# Patient Record
Sex: Female | Born: 1955 | Race: Black or African American | Hispanic: No | Marital: Single | Smoking: Current every day smoker
Health system: Southern US, Community
[De-identification: ages and names within clinical notes are randomized; demographics above are authoritative.]

## PROBLEM LIST (undated history)

## (undated) DIAGNOSIS — E785 Hyperlipidemia, unspecified: Secondary | ICD-10-CM

## (undated) HISTORY — DX: Hyperlipidemia, unspecified: E78.5

---

## 1978-12-21 HISTORY — PX: ABDOMINAL HYSTERECTOMY: SHX81

## 2005-07-02 ENCOUNTER — Emergency Department: Payer: Self-pay | Admitting: Emergency Medicine

## 2005-11-10 ENCOUNTER — Emergency Department: Payer: Self-pay | Admitting: Emergency Medicine

## 2006-01-23 ENCOUNTER — Emergency Department: Payer: Self-pay | Admitting: Emergency Medicine

## 2006-07-15 IMAGING — CT CT STONE STUDY
1 of 2 series · 15 of 32 positions shown, 19 images · non-contrast
Comparison: none

REASON FOR EXAM: Abdominal pain
COMMENTS:

[Series 2: stone · axial · 0.72mm/px · z∈[-989,-626]mm · 15 of 136 slices shown, 19 images]
[im 10/136  soft-tissue]
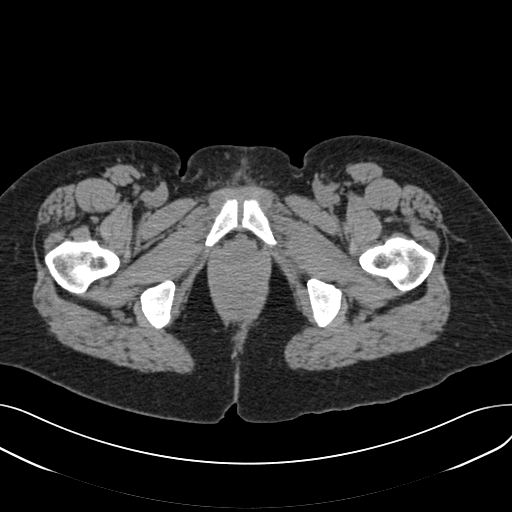
[im 10/136  bone]
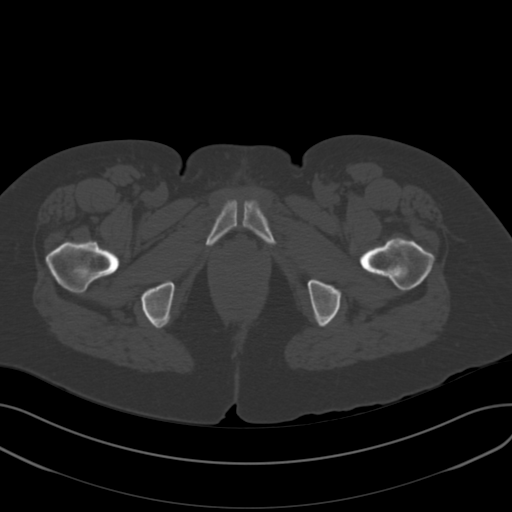
[im 20/136  soft-tissue]
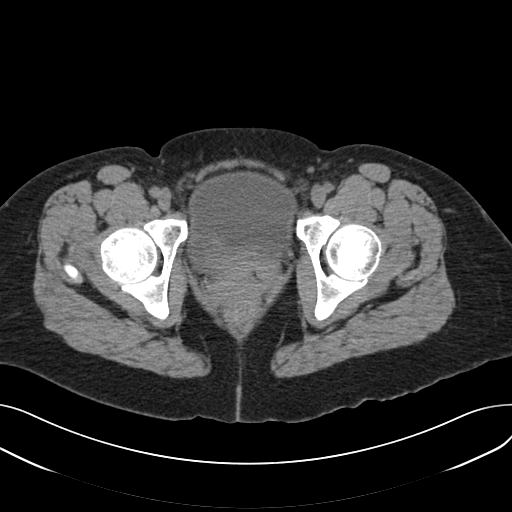
[im 29/136  soft-tissue]
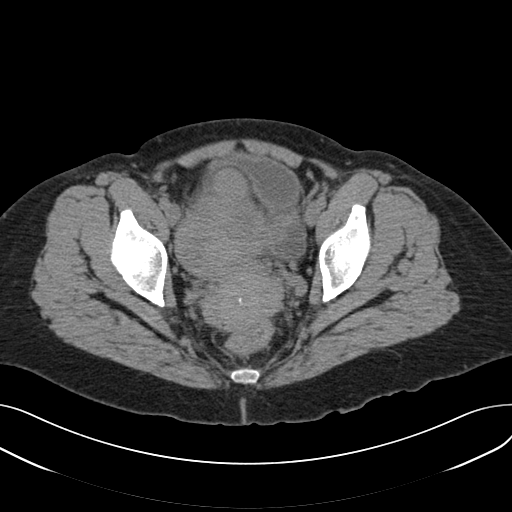
[im 39/136  soft-tissue]
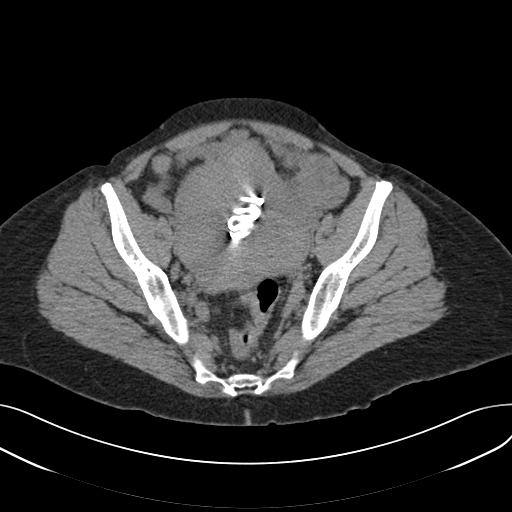
[im 49/136  soft-tissue]
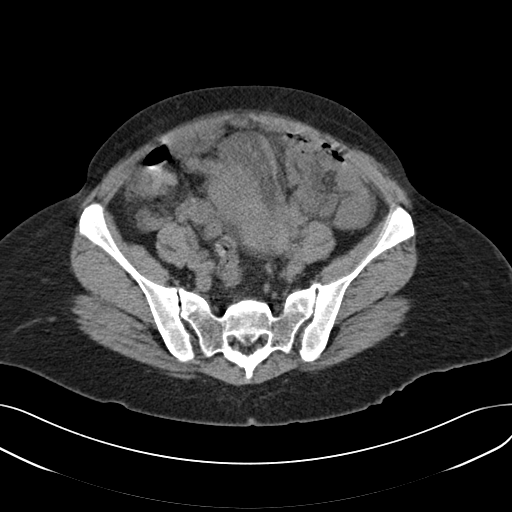
[im 58/136  soft-tissue]
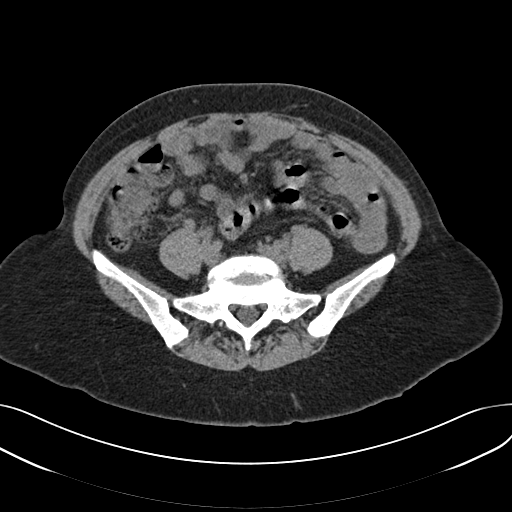
[im 68/136  soft-tissue]
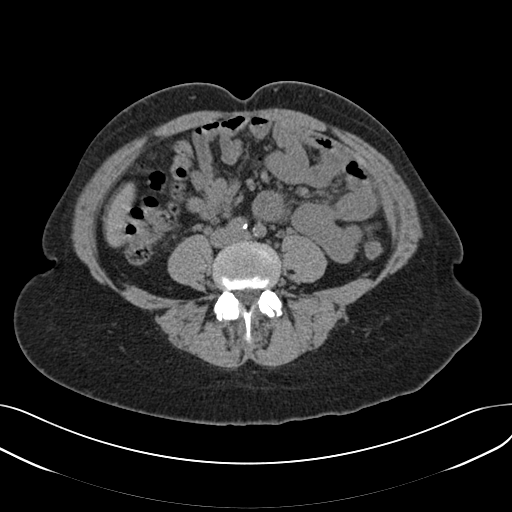
[im 78/136  soft-tissue]
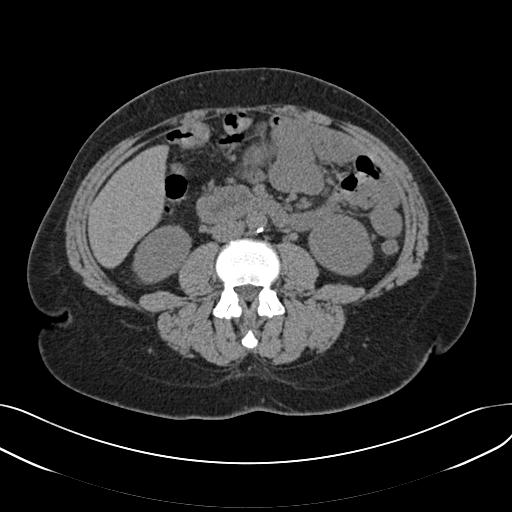
[im 87/136  soft-tissue]
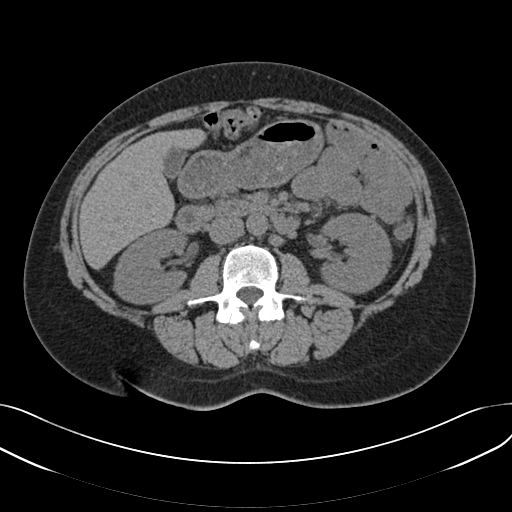
[im 87/136  bone]
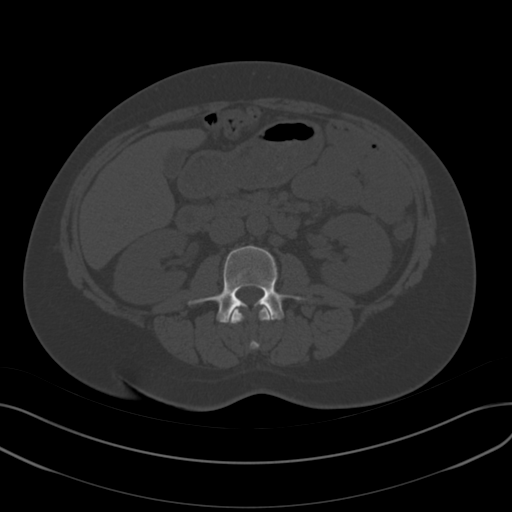
[im 97/136  soft-tissue]
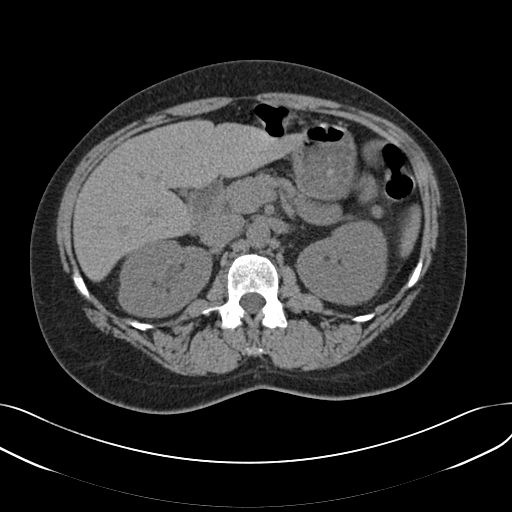
[im 107/136  soft-tissue]
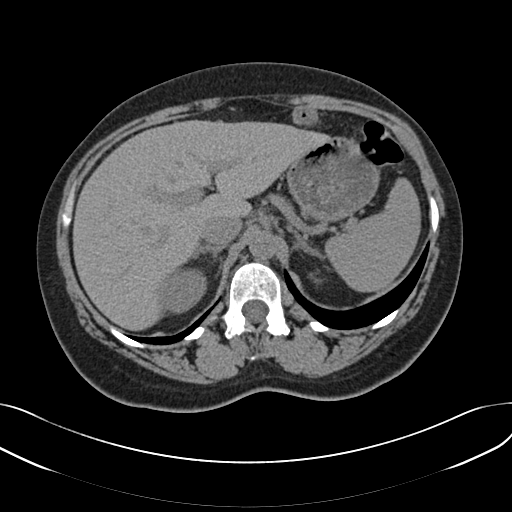
[im 116/136  soft-tissue]
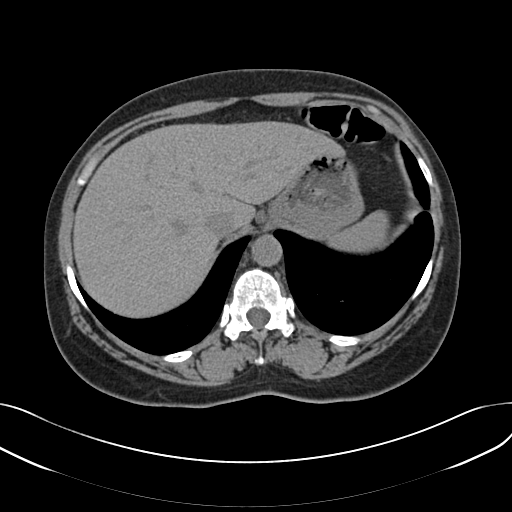
[im 116/136  lung]
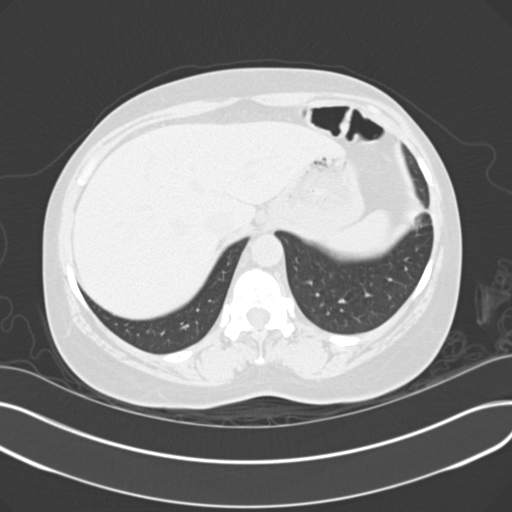
[im 121/136  lung]
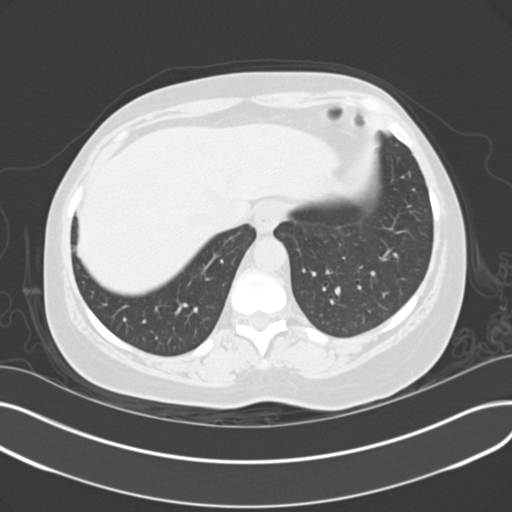
[im 126/136  soft-tissue]
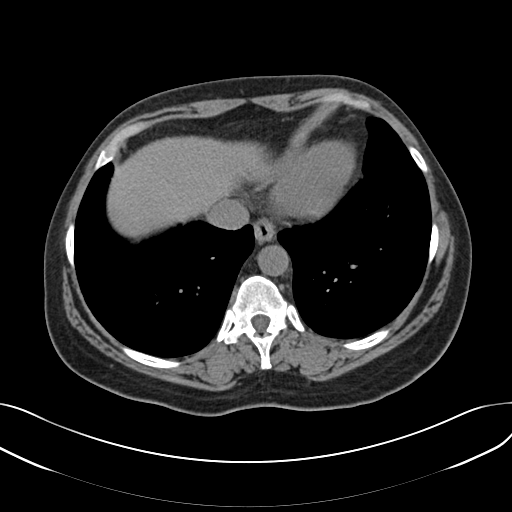
[im 126/136  lung]
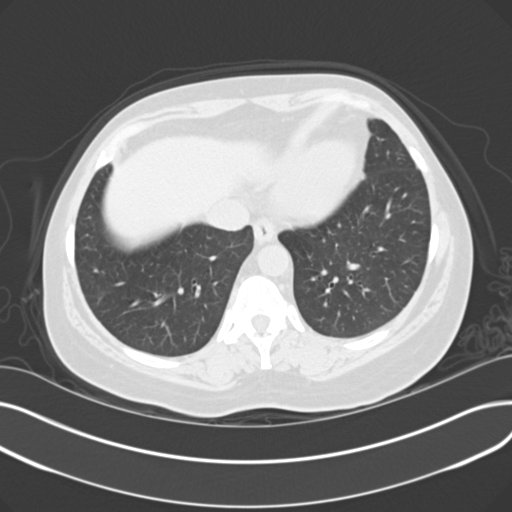
[im 131/136  lung]
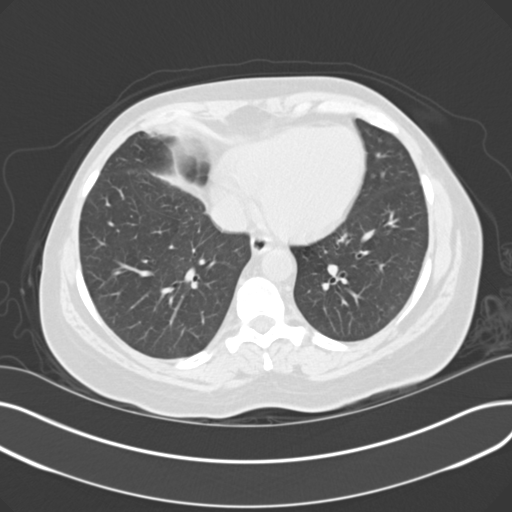

[15 of 32 positions shown; findings below may reference images not displayed]

PROCEDURE:     CT  - CT ABDOMEN /PELVIS WO (STONE)  - January 24, 2006  [DATE]

RESULT:          Spiral noncontrasted 3 mm sections were obtained from the
lung bases through the pubic symphysis.

Evaluation of the lung bases demonstrates no gross abnormalities.

Evaluation of the RIGHT kidney demonstrates a small 2 mm calculus within the
anterior mid pole medullary portion.  There is no evidence of associated
obstructive uropathy.  Evaluation of the RIGHT ureter, LEFT ureter and LEFT
kidney demonstrates no further evidence of calculus disease nor
hydronephrosis or hydronephrosis.

Noncontrast evaluation of the liver, spleen, adrenals and pancreas is
unremarkable.  There is no evidence of abdominal free fluid, drainable
loculated fluid collections, masses nor adenopathy.

Evaluation of the uterus demonstrates enlargement of the fundus of the
uterus, demonstrating a nodular pattern.  The ovaries are poorly visualized.
 An intrauterine device is demonstrated within fundus of the uterus.
Incidental note is made of Drey Jim cyst.
IMPRESSION: 1.     A 2 mm nonobstructing RIGHT renal calculus without hydronephrosis nor
hydroureter.
2.     Evaluation of the RIGHT lower quadrant demonstrates no CT evidence to
suggest appendicitis.
3.     Findings which appear to be consistent with fibroids involving the
uterus, considering the enlargement and uterine contour.
4.     Note, not mentioned above, a loculated low attenuating area projects
within the anterolateral portion of the uterine fundus on the LEFT.  This
area may represent the sequela of an ovarian cyst.  The uterine findings can
be further evaluation with pelvic ultrasound if clinically warranted.

## 2016-08-05 ENCOUNTER — Ambulatory Visit: Payer: Self-pay

## 2016-08-05 ENCOUNTER — Encounter: Payer: Self-pay | Admitting: *Deleted

## 2016-08-05 ENCOUNTER — Ambulatory Visit: Payer: Self-pay | Attending: Internal Medicine | Admitting: *Deleted

## 2016-08-05 ENCOUNTER — Encounter (INDEPENDENT_AMBULATORY_CARE_PROVIDER_SITE_OTHER): Payer: Self-pay

## 2016-08-05 ENCOUNTER — Ambulatory Visit
Admission: RE | Admit: 2016-08-05 | Discharge: 2016-08-05 | Disposition: A | Discharge status: Home / Self Care | Payer: Self-pay | Source: Ambulatory Visit | Attending: Oncology | Admitting: Oncology

## 2016-08-05 VITALS — BP 115/75 | HR 74 | Temp 98.0°F | Ht 63.39 in | Wt 174.5 lb

## 2016-08-05 DIAGNOSIS — Z0000 Encounter for general adult medical examination without abnormal findings: Secondary | ICD-10-CM

## 2016-08-05 NOTE — Patient Instructions (Signed)
Gave patient hand-out, Women Staying Healthy, Active and Well from [facility], with education on breast health, pap smears, heart and colon health. 

## 2016-08-05 NOTE — Progress Notes (Signed)
Subjective:     Patient ID: Karen Faulkner, female   DOB: 1956/09/22, 60 y.o.   MRN: [id number]  HPI   Review of Systems     Objective:   Physical Exam  Pulmonary/Chest: Right breast exhibits no inverted nipple, no mass, no nipple discharge, no skin change and no tenderness. Left breast exhibits no inverted nipple, no mass, no nipple discharge, no skin change and no tenderness. Breasts are symmetrical.       Assessment:     60 [facility] Black [facility].  Clinical breast exam unremarkable.  Taught self breast awareness.  Patient has been screened for eligibility.  Patient had pap 2017.  She is to follow up per guidelines from her provider.  She does not have any insurance, Medicare or Medicaid.  She also meets financial eligibility.  Hand-out given on the Affordable Care Act.    Plan:     Screening mammogram ordered.  Will follow up per [facility] protocol.

## 2016-08-06 ENCOUNTER — Other Ambulatory Visit: Payer: Self-pay | Admitting: *Deleted

## 2016-08-06 DIAGNOSIS — N63 Unspecified lump in unspecified breast: Secondary | ICD-10-CM

## 2016-08-11 ENCOUNTER — Other Ambulatory Visit: Payer: Self-pay | Admitting: *Deleted

## 2016-08-11 DIAGNOSIS — N63 Unspecified lump in unspecified breast: Secondary | ICD-10-CM

## 2016-08-13 ENCOUNTER — Other Ambulatory Visit: Payer: Self-pay | Admitting: *Deleted

## 2016-08-13 ENCOUNTER — Ambulatory Visit
Admission: RE | Admit: 2016-08-13 | Discharge: 2016-08-13 | Disposition: A | Discharge status: Home / Self Care | Payer: Self-pay | Source: Ambulatory Visit | Attending: Oncology | Admitting: Oncology

## 2016-08-13 DIAGNOSIS — N63 Unspecified lump in unspecified breast: Secondary | ICD-10-CM

## 2016-08-19 ENCOUNTER — Other Ambulatory Visit: Payer: Self-pay | Admitting: *Deleted

## 2016-08-19 DIAGNOSIS — N63 Unspecified lump in unspecified breast: Secondary | ICD-10-CM

## 2016-09-04 ENCOUNTER — Ambulatory Visit: Admission: RE | Admit: 2016-09-04 | Payer: Self-pay | Source: Ambulatory Visit

## 2016-09-04 ENCOUNTER — Ambulatory Visit: Payer: Self-pay | Attending: Internal Medicine

## 2016-09-07 ENCOUNTER — Telehealth: Payer: Self-pay | Admitting: *Deleted

## 2016-09-07 NOTE — Telephone Encounter (Signed)
Called patient.  She had no-showed her appointment for her biopsy on Friday.  Called and rescheduled her for 09/25/16 @ 8:00.  Patient requested a Friday appointment.  Stressed importance of keeping her appointment.

## 2016-09-25 ENCOUNTER — Ambulatory Visit: Payer: Self-pay | Attending: Internal Medicine

## 2016-09-25 ENCOUNTER — Ambulatory Visit: Admission: RE | Admit: 2016-09-25 | Payer: Self-pay | Source: Ambulatory Visit

## 2016-09-29 ENCOUNTER — Telehealth: Payer: Self-pay | Admitting: *Deleted

## 2016-09-29 NOTE — Telephone Encounter (Signed)
Patient no-showed for her biopsy for the second time.  I have left her a message to return my call.  Will try and see if there are transportation barriers.

## 2016-10-15 ENCOUNTER — Telehealth: Payer: Self-pay | Admitting: *Deleted

## 2016-10-15 ENCOUNTER — Encounter: Payer: Self-pay | Admitting: *Deleted

## 2016-10-15 NOTE — Telephone Encounter (Signed)
Patient has not rescheduled her appointment for her breast biopsy.  Left her a message to return my call.  Will mail certified letter to her.  [facility] to [place].

## 2016-10-27 ENCOUNTER — Encounter: Payer: Self-pay | Admitting: *Deleted

## 2016-10-27 NOTE — Progress Notes (Signed)
Received unsigned certified letter.  Per envelope, patient has moved and no forwarding address.

## 2017-01-21 ENCOUNTER — Encounter: Payer: Self-pay | Admitting: *Deleted

## 2017-01-26 ENCOUNTER — Encounter: Payer: Self-pay | Admitting: *Deleted

## 2017-01-27 ENCOUNTER — Ambulatory Visit: Payer: PRIVATE HEALTH INSURANCE | Admitting: General Surgery

## 2017-02-08 ENCOUNTER — Inpatient Hospital Stay: Payer: Self-pay

## 2017-02-08 ENCOUNTER — Ambulatory Visit (INDEPENDENT_AMBULATORY_CARE_PROVIDER_SITE_OTHER): Payer: PRIVATE HEALTH INSURANCE | Admitting: General Surgery

## 2017-02-08 ENCOUNTER — Other Ambulatory Visit: Payer: Self-pay | Admitting: General Surgery

## 2017-02-08 ENCOUNTER — Encounter: Payer: Self-pay | Admitting: General Surgery

## 2017-02-08 VITALS — BP 124/76 | HR 80 | Resp 12 | Ht 63.0 in | Wt 180.0 lb

## 2017-02-08 DIAGNOSIS — N6320 Unspecified lump in the left breast, unspecified quadrant: Secondary | ICD-10-CM | POA: Diagnosis not present

## 2017-02-08 NOTE — Progress Notes (Signed)
Patient ID: Karen Faulkner, female   DOB: [birth year], 61 y.o.   MRN: [id number]  Chief Complaint  Patient presents with  . Other    HPI Karen Faulkner is a 61 y.o. female here today for a evaluation of a left breast mass. Last mammogram 07/2016. She does not perform self breast checks, denies feeling a lump or mass. Family history of breast cancer in both sisters. She has one sister that is 71 and a second sister 86 years old, both recently diagnosed and  undergoing treatment. She has not had genetic testing.  I have reviewed the history of present illness with the patient. HPI  Past Medical History:  Diagnosis Date  . Hyperlipidemia     Past Surgical History:  Procedure Laterality Date  . ABDOMINAL HYSTERECTOMY  1980   partial    Family History  Problem Relation Age of Onset  . Breast cancer Maternal Aunt   . Cancer Maternal Aunt     breast  . Cancer Sister     Breast  . Cancer Sister     Breast    Social History Social History  Substance Use Topics  . Smoking status: Current Every Day Smoker    Types: Cigarettes  . Smokeless tobacco: Never Used  . Alcohol use Yes     Comment: occasional    No Known Allergies  Current Outpatient Prescriptions  Medication Sig Dispense Refill  . atenolol (TENORMIN) 100 MG tablet Take by mouth.    . Cholecalciferol (VITAMIN D-3) 1000 units CAPS Take by mouth.    Marland Kitchen lisinopril (PRINIVIL,ZESTRIL) 10 MG tablet Take 10 mg by mouth daily.    . Multiple Vitamin (MULTI-VITAMINS) TABS Take by mouth.    . Vitamin E 200 units TABS Take by mouth.     No current facility-administered medications for this visit.     Review of Systems Review of Systems  Constitutional: Negative.   Respiratory: Negative.   Cardiovascular: Negative.     Blood pressure 124/76, pulse 80, resp. rate 12, height 5' 3" (1.6 m), weight 180 lb (81.6 kg).  Physical Exam Physical Exam  Constitutional: She is oriented to person, place, and time. She appears  well-developed and well-nourished.  Eyes: Conjunctivae are normal. No scleral icterus.  Neck: Neck supple. No thyromegaly present.  Cardiovascular: Normal rate and regular rhythm.   Pulmonary/Chest: Effort normal and breath sounds normal. Right breast exhibits no inverted nipple, no mass, no nipple discharge, no skin change and no tenderness. Left breast exhibits no inverted nipple, no mass, no nipple discharge, no skin change and no tenderness. Breasts are symmetrical.  Abdominal: Soft. Bowel sounds are normal. There is no tenderness.  Lymphadenopathy:    She has no cervical adenopathy.    She has no axillary adenopathy.  Neurological: She is alert and oriented to person, place, and time.  Skin: Skin is warm and dry.    Data Reviewed Notes and mammogram/ultrasound reviewed US showed a thick walled hypoechoic mass at 12 ocl left breast 3cm from nipple. Assessment    Strong FH of breast cancer. Indeterminate mass left breast    Plan   Core biopsy recommended and pt was agreeable. Completed today. Also discussed BRCA testing and she is agreeable to this also   [place] REPORT  Name:  Karen Faulkner DOB:  10/30/1956  Vital signs:BP 124/76   Pulse 80   Resp 12   Ht 5' 3" (1.6 m)   Wt 180 lb (81.6 kg)  BMI 31.89 kg/m   Lesion:  mass  Location:    Left   12o'clock position, 3cm fn  Local anesthetic:   28m of 1% Xylocaine/0.5% Marcaine  Prep: Chloro prep  Device:  14Gauge  Bard  Ultrasound guidance: used  Patient tolerance:  Patient tolerated the procedure well   Approach: LM  Clip: placed  Dressing: steristrips telfa and tegaderm Ice pack applied. Written instructions provided to patient regarding wound care.   Patient advised that patient will be contacted by phone when pathology report is available.  Followup appointment to be scheduled after pathology.   CC: ADonnie Coffin MD, FClementeen GrahamG      This information has  been scribed by RVerlene Mayer CMendota Karen Faulkner 02/08/2017, 9:49 AM

## 2017-02-08 NOTE — Patient Instructions (Signed)
Breast Biopsy, Care After Introduction Refer to this sheet in the next few weeks. These instructions provide you with information about caring for yourself after your procedure. Your health care provider may also give you more specific instructions. Your treatment has been planned according to current medical practices, but problems sometimes occur. Call your health care provider if you have any problems or questions after your procedure. What can I expect after the procedure? After your procedure, it is common to have:  Bruising on your breast.  Numbness, tingling, or pain near your biopsy site. Follow these instructions at home: Medicines  Take over-the-counter and prescription medicines only as told by your health care provider.  Do not drive for 24 hours if you received a sedative.  Do not drink alcohol while taking pain medicine.  Do not drive or operate heavy machinery while taking prescription pain medicine. Biopsy Site Care   Follow instructions from your health care provider about how to take care of your incision or puncture site. Make sure you:  Wash your hands with soap and water before you change your dressing. If soap and water are not available, use hand sanitizer.  Change any bandages (dressings) as told by your health care provider.  Leave any stitches (sutures), skin glue, or adhesive strips in place. These skin closures may need to stay in place for 2 weeks or longer. If adhesive strip edges start to loosen and curl up, you may trim the loose edges. Do not remove adhesive strips completely unless your health care provider tells you to do that.  If you have sutures, keep them dry when bathing.  Check your incision or puncture area every day for signs of infection. Check for:  More redness, swelling, or pain.  More fluid or blood.  Warmth.  Pus or a bad smell.  Protect the biopsy area. Do not let the area get bumped. Activity  If you had an incision during  your procedure,avoid activities that may pull the incision site open. Avoid stretching, reaching, exercise, sports, or lifting anything that is heavier than 3 lb (1.4 kg).  Return to your normal activities as told by your health care provider. Ask your health care provider what activities are safe for you. General instructions  Resume your usual diet.  Wear a good support bra for as long as told by your health care provider.  Get checked for extra fluid around your lymph nodes (lymphedema) as often as told by your health care provider.  Keep all follow-up visits as told by your health care provider. This is important. Contact a health care provider if:  You have more redness, swelling, or pain at the biopsy site.  You have more fluid or blood coming from your biopsy site.  Your biopsy site feels warm to the touch.  You have pus or a bad smell coming from the biopsy site.  Your biopsy site breaks open after the sutures, staples, or skin adhesive strips have been removed.  You have a rash.  You have a fever. Get help right away if:  You have increased bleeding (more than a small spot) from the biopsy site.  You have difficulty breathing.  You have red streaks around the biopsy site. This information is not intended to replace advice given to you by your health care provider. Make sure you discuss any questions you have with your health care provider. Document Released: 06/26/2005 Document Revised: 08/13/2016 Document Reviewed: 09/10/2015  2017 [employer]vier

## 2017-02-10 ENCOUNTER — Telehealth: Payer: Self-pay | Admitting: *Deleted

## 2017-02-10 LAB — PATHOLOGY

## 2017-02-10 NOTE — Telephone Encounter (Signed)
-----   Message from Kieth BrightlySeeplaputhur G Sankar, MD sent at 02/10/2017  8:17 AM EST ----- Please inform pt path is benign. Follow up in 3mos.

## 2017-02-16 ENCOUNTER — Telehealth: Payer: Self-pay | Admitting: *Deleted

## 2017-02-16 NOTE — Telephone Encounter (Signed)
Patient did not show up today for her genetic testing per request of Dr. Evette CristalSankar.  I have rescheduled her for 02/18/17 @ 8:30.

## 2017-02-16 NOTE — Telephone Encounter (Signed)
Notified patient as instructed, patient pleased. Discussed follow-up appointments, patient agrees  

## 2017-02-23 ENCOUNTER — Encounter: Payer: Self-pay | Admitting: *Deleted

## 2017-02-23 NOTE — Progress Notes (Signed)
Per request of Dr. Evette CristalSankar, patient presents today for completion of genetic testing.  Patient with benign breast biopsy on 02/08/17.  Significant family history of cancer as follows:  Sister with breast cancer in her 3260's, a second sister with lung cancer, breast cancer and thyroid cancer diagnosed in her 6360's, and 1 maternal aunt with breast cancer, age unknown.  Blood specimen collected for genetic testing through [facility].  Patient is uninsured and one of our [facility] patients.  Application for patient assistance sent with the specimen via [employer] to [facility].  Patient informed that Dr. Evette CristalSankar would get her results, probably within the next 3 weeks.  She is to follow-up with him as indicated.

## 2017-03-09 ENCOUNTER — Encounter: Payer: Self-pay | Admitting: General Surgery

## 2017-03-15 ENCOUNTER — Telehealth: Payer: Self-pay | Admitting: *Deleted

## 2017-03-15 NOTE — Telephone Encounter (Signed)
Left patient a message that I have her genetic test results, and they are negative.  Encouraged her to give a call so I can review them with her.  I will also mail a copy for her personal use.

## 2017-07-26 ENCOUNTER — Ambulatory Visit: Payer: PRIVATE HEALTH INSURANCE | Admitting: General Surgery

## 2017-07-26 ENCOUNTER — Encounter: Payer: PRIVATE HEALTH INSURANCE | Admitting: General Surgery

## 2017-07-26 NOTE — Progress Notes (Deleted)
Patient ID: Karen Faulkner, female   DOB: 06-16-1956, 61 y.o.   MRN: [id number]  Chief Complaint  Patient presents with  . Follow-up    Left breast mass    HPI Karen FuseMaggie B Faulkner is a 61 y.o. female who presents for a left breast mass follow up. Patient states   HPI  Past Medical History:  Diagnosis Date  . Hyperlipidemia     Past Surgical History:  Procedure Laterality Date  . ABDOMINAL HYSTERECTOMY  1980   partial    Family History  Problem Relation Age of Onset  . Breast cancer Maternal Aunt   . Cancer Maternal Aunt        breast  . Cancer Sister        Breast  . Cancer Sister        Breast    Social History Social History  Substance Use Topics  . Smoking status: Current Every Day Smoker    Types: Cigarettes  . Smokeless tobacco: Never Used  . Alcohol use Yes     Comment: occasional    No Known Allergies  Current Outpatient Prescriptions  Medication Sig Dispense Refill  . atenolol (TENORMIN) 100 MG tablet Take by mouth.    . Cholecalciferol (VITAMIN D-3) 1000 units CAPS Take by mouth.    Marland Kitchen. lisinopril (PRINIVIL,ZESTRIL) 10 MG tablet Take 10 mg by mouth daily.    . Multiple Vitamin (MULTI-VITAMINS) TABS Take by mouth.    . Vitamin E 200 units TABS Take by mouth.     No current facility-administered medications for this visit.     Review of Systems Review of Systems  There were no vitals taken for this visit.  Physical Exam Physical Exam  Data Reviewed ***  Assessment    ***    Plan    ***       Safal Halderman 07/26/2017, 9:01 AM

## 2017-08-02 NOTE — Progress Notes (Signed)
This encounter was created in error - please disregard.  This encounter was created in error - please disregard.

## 2017-09-29 ENCOUNTER — Encounter: Payer: Self-pay | Admitting: *Deleted

## 2018-06-13 IMAGING — US US BREAST*L* LIMITED INC AXILLA
1 series · 13 of 16 positions shown · non-contrast
Comparison: Screening mammogram dated 08/05/2016.

CLINICAL DATA: 59-year-old female presenting for recall from
screening mammogram for possible left breast masses.

EXAM:
2D DIGITAL DIAGNOSTIC LEFT MAMMOGRAM WITH CAD AND ADJUNCT TOMO
ULTRASOUND LEFT BREAST

[Series 1: us breast*left* limited inc axilla · 0.06mm/px · 13 of 16 slices shown]
[im 1/16]
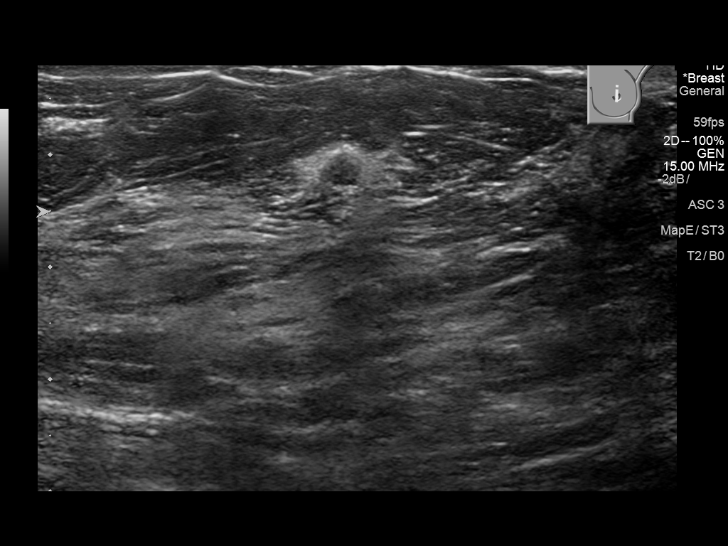
[im 2/16]
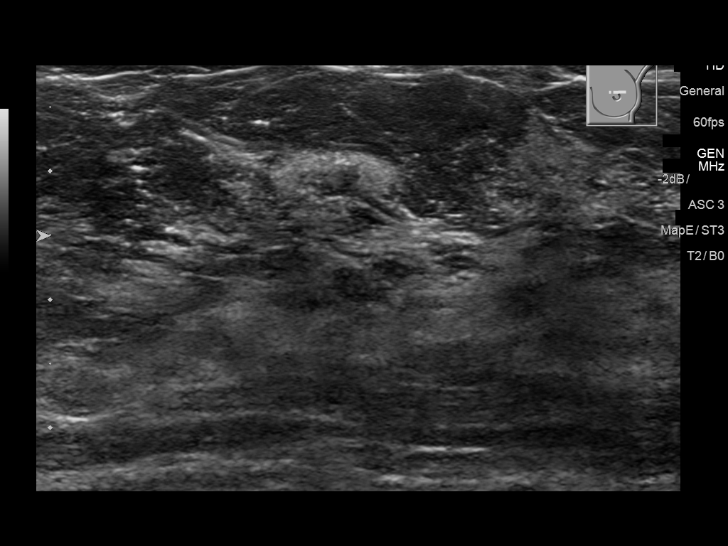
[im 4/16]
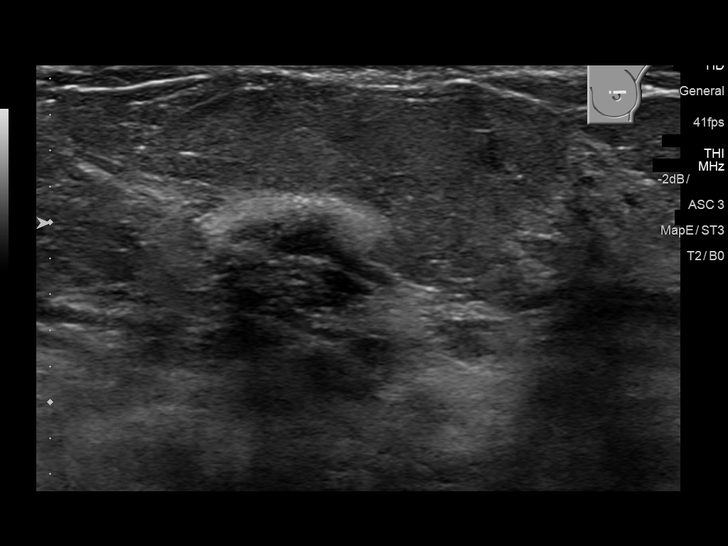
[im 5/16]
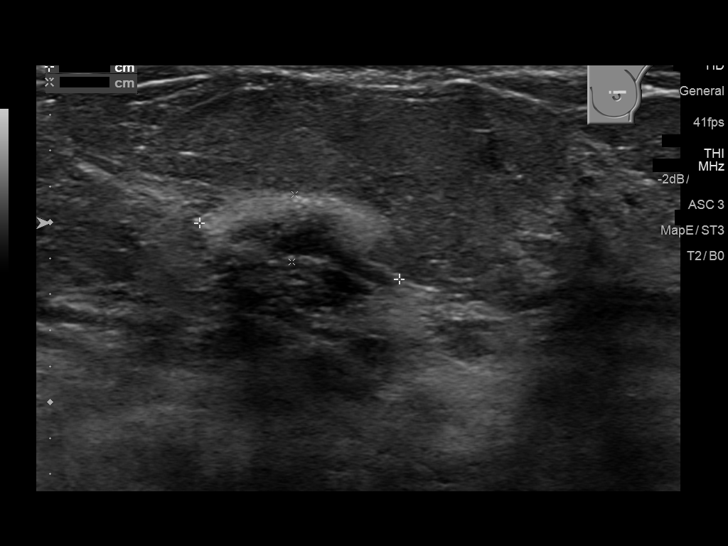
[im 6/16]
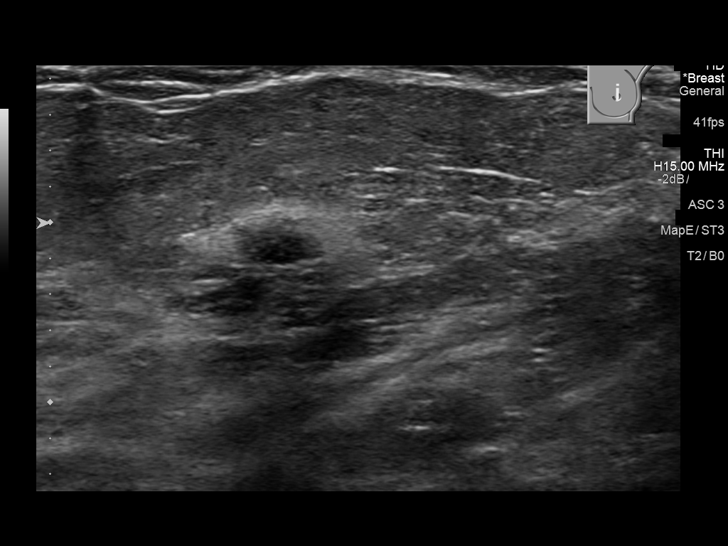
[im 7/16]
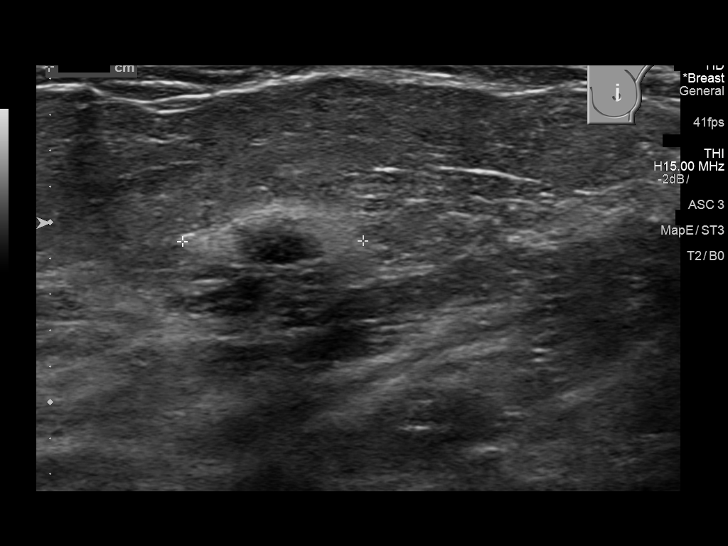
[im 9/16]
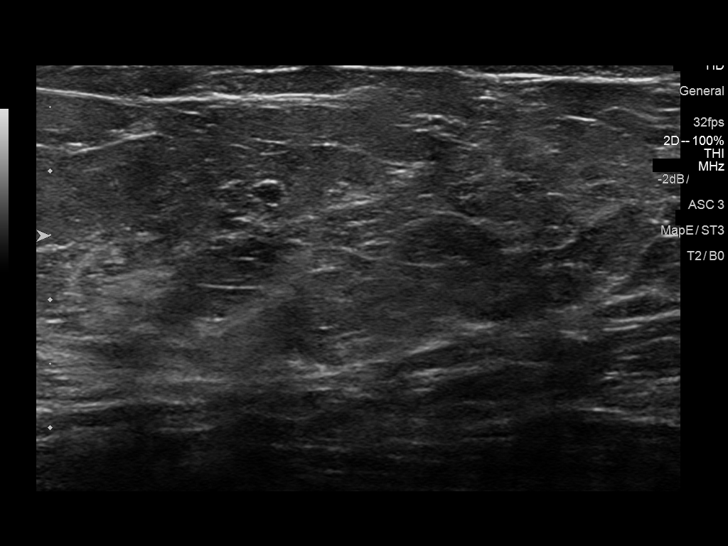
[im 10/16]
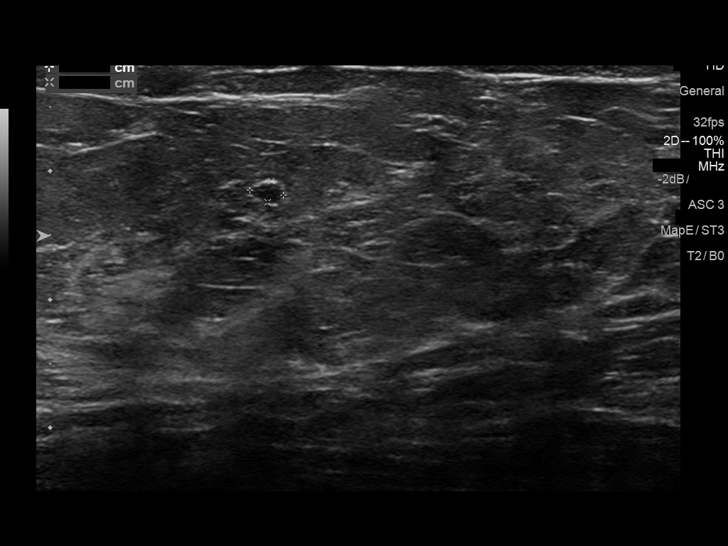
[im 11/16]
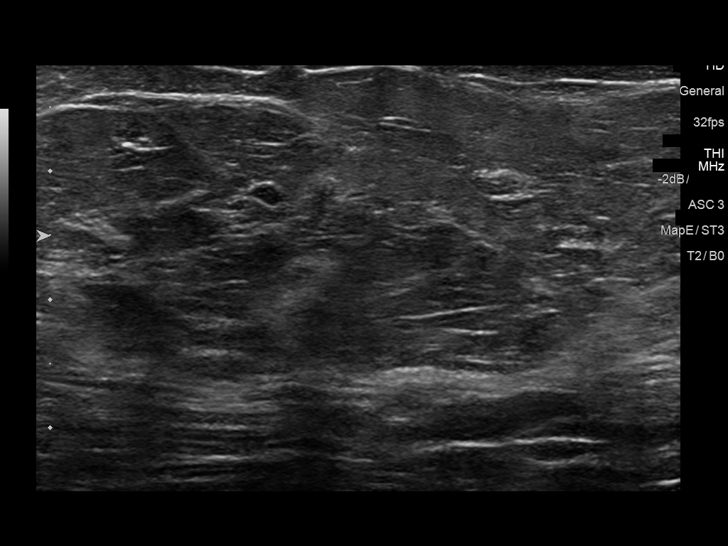
[im 12/16]
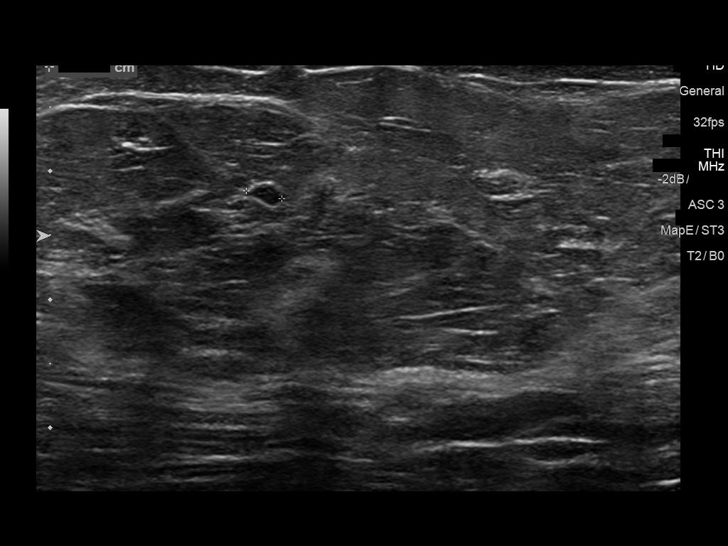
[im 13/16]
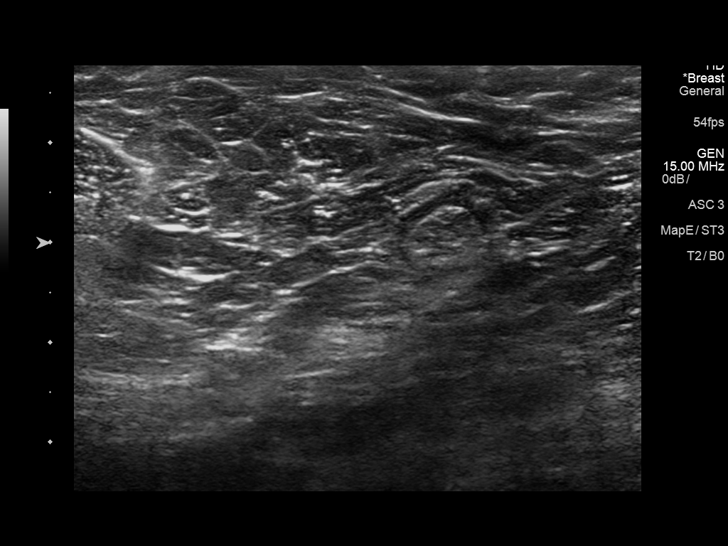
[im 15/16]
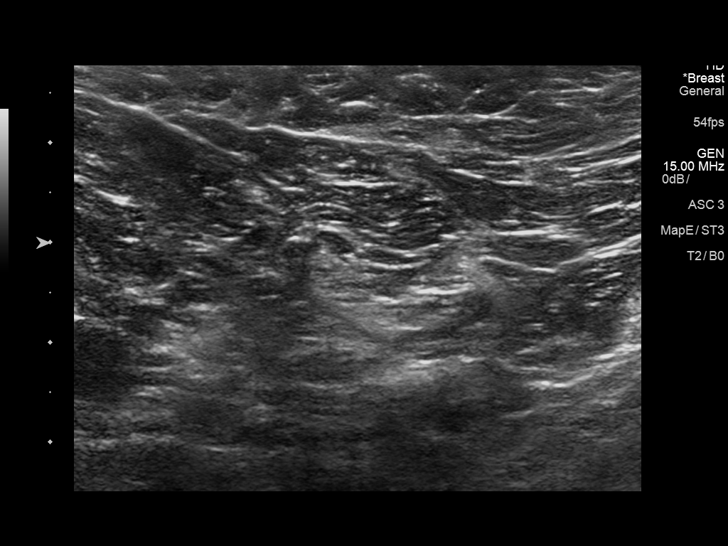
[im 16/16]
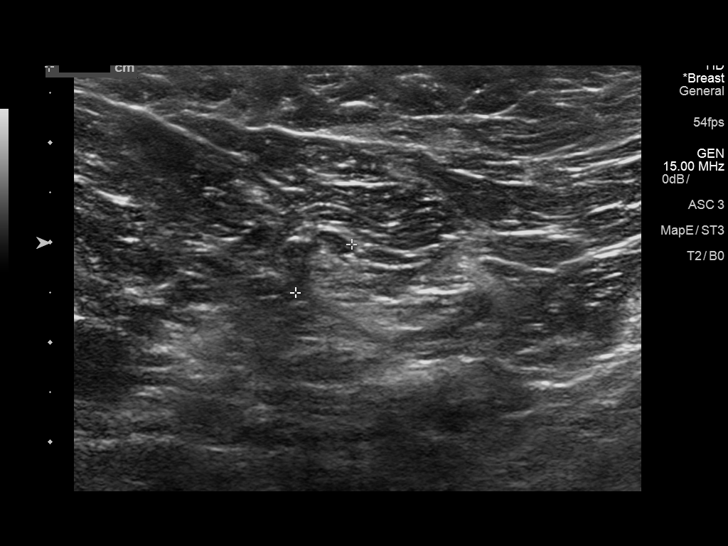

[13 of 16 positions shown; findings below may reference images not displayed]

ACR Breast Density Category b: There are scattered areas of
fibroglandular density.
FINDINGS: The question mass in the subareolar left breast resolves with spot
compression tomosynthesis imaging consistent with overlapping
fibroglandular tissue. There is a persistent oval 7 mm mass in the
medial left breast, middle depth.

Mammographic images were processed with CAD.

On physical exam, no discrete palpable masses are identified in the
upper inner quadrant of the right breast.

Targeted ultrasound is performed, showing a few simple appearing at
ectatic ducts, as well as a few small circumscribed oval masses
which may represent cysts versus focally dilated ducts. An example
was imaged at 11 o'clock 6 cm from the nipple which measured 3 mm.
In the superior left breast at 12 o'clock, 3 cm from the nipple
there is a hypoechoic mass with an echogenic rim which appears to be
associated with a duct. This measures 1.2 x 0.4 x 1.0 cm. No blood
flow was identified on color Doppler imaging. Ultrasound of the left
axilla demonstrates normal-appearing lymph nodes.
IMPRESSION: 1.  There is an indeterminate mass in the left breast at 12 o'clock.

2.  No evidence of left axillary lymphadenopathy.

RECOMMENDATION:
Ultrasound-guided biopsy is recommended for the left breast mass
which will be scheduled at the patient's convenience.

I have discussed the findings and recommendations with the patient.
Results were also provided in writing at the conclusion of the
visit. If applicable, a reminder letter will be sent to the patient
regarding the next appointment.

BI-RADS CATEGORY  4: Suspicious.

## 2023-09-06 ENCOUNTER — Emergency Department: Payer: Self-pay

## 2023-09-06 ENCOUNTER — Other Ambulatory Visit: Payer: Self-pay

## 2023-09-06 ENCOUNTER — Emergency Department
Admission: EM | Admit: 2023-09-06 | Discharge: 2023-09-06 | Disposition: A | Discharge status: Home / Self Care | Payer: Self-pay | Attending: Emergency Medicine | Admitting: Emergency Medicine

## 2023-09-06 DIAGNOSIS — Z1152 Encounter for screening for COVID-19: Secondary | ICD-10-CM | POA: Insufficient documentation

## 2023-09-06 DIAGNOSIS — R1011 Right upper quadrant pain: Secondary | ICD-10-CM | POA: Insufficient documentation

## 2023-09-06 DIAGNOSIS — Z716 Tobacco abuse counseling: Secondary | ICD-10-CM | POA: Insufficient documentation

## 2023-09-06 DIAGNOSIS — I10 Essential (primary) hypertension: Secondary | ICD-10-CM | POA: Insufficient documentation

## 2023-09-06 DIAGNOSIS — J181 Lobar pneumonia, unspecified organism: Secondary | ICD-10-CM | POA: Insufficient documentation

## 2023-09-06 DIAGNOSIS — J189 Pneumonia, unspecified organism: Secondary | ICD-10-CM

## 2023-09-06 DIAGNOSIS — R110 Nausea: Secondary | ICD-10-CM | POA: Insufficient documentation

## 2023-09-06 LAB — CBC
HCT: 35.6 % — ABNORMAL LOW (ref 36.0–46.0)
Hemoglobin: 12.2 g/dL (ref 12.0–15.0)
MCH: 28.2 pg (ref 26.0–34.0)
MCHC: 34.3 g/dL (ref 30.0–36.0)
MCV: 82.2 fL (ref 80.0–100.0)
Platelets: 193 10*3/uL (ref 150–400)
RBC: 4.33 MIL/uL (ref 3.87–5.11)
RDW: 14.6 % (ref 11.5–15.5)
WBC: 16.8 10*3/uL — ABNORMAL HIGH (ref 4.0–10.5)
nRBC: 0 % (ref 0.0–0.2)

## 2023-09-06 LAB — RESP PANEL BY RT-PCR (RSV, FLU A&B, COVID)  RVPGX2
Influenza A by PCR: NEGATIVE
Influenza B by PCR: NEGATIVE
Resp Syncytial Virus by PCR: NEGATIVE
SARS Coronavirus 2 by RT PCR: NEGATIVE

## 2023-09-06 LAB — HEPATIC FUNCTION PANEL
ALT: 70 U/L — ABNORMAL HIGH (ref 0–44)
AST: 53 U/L — ABNORMAL HIGH (ref 15–41)
Albumin: 3.6 g/dL (ref 3.5–5.0)
Alkaline Phosphatase: 91 U/L (ref 38–126)
Bilirubin, Direct: <0.1 mg/dL (ref 0.0–0.2)
Total Bilirubin: 0.8 mg/dL (ref 0.3–1.2)
Total Protein: 7.3 g/dL (ref 6.5–8.1)

## 2023-09-06 LAB — BASIC METABOLIC PANEL
Anion gap: 13 (ref 5–15)
BUN: 33 mg/dL — ABNORMAL HIGH (ref 8–23)
CO2: 25 mmol/L (ref 22–32)
Calcium: 9.7 mg/dL (ref 8.9–10.3)
Chloride: 100 mmol/L (ref 98–111)
Creatinine, Ser: 1.29 mg/dL — ABNORMAL HIGH (ref 0.44–1.00)
GFR, Estimated: 46 mL/min — ABNORMAL LOW (ref >60)
Glucose, Bld: 146 mg/dL — ABNORMAL HIGH (ref 70–99)
Potassium: 3.4 mmol/L — ABNORMAL LOW (ref 3.5–5.1)
Sodium: 138 mmol/L (ref 135–145)

## 2023-09-06 LAB — TROPONIN I (HIGH SENSITIVITY): Troponin I (High Sensitivity): 6 ng/L (ref <18)

## 2023-09-06 LAB — LIPASE, BLOOD: Lipase: 23 U/L (ref 11–51)

## 2023-09-06 MED ORDER — AMOXICILLIN-POT CLAVULANATE 875-125 MG PO TABS: 1.0000 | ORAL_TABLET | Freq: Two times a day (BID) | ORAL | 0 refills | Status: AC

## 2023-09-06 MED ORDER — SODIUM CHLORIDE 0.9 % IV SOLN
500.0000 mg | Freq: Once | INTRAVENOUS | Status: DC
  Filled 2023-09-06: qty 5

## 2023-09-06 MED ORDER — AZITHROMYCIN 500 MG PO TABS
500.0000 mg | ORAL_TABLET | Freq: Once | ORAL | Status: AC
  Administered 2023-09-06: 500 mg via ORAL
  Filled 2023-09-06: qty 1

## 2023-09-06 MED ORDER — ALBUTEROL SULFATE (2.5 MG/3ML) 0.083% IN NEBU: 2.5000 mg | INHALATION_SOLUTION | RESPIRATORY_TRACT | Status: DC | PRN

## 2023-09-06 MED ORDER — NICOTINE POLACRILEX 4 MG MT LOZG: 4.0000 mg | LOZENGE | OROMUCOSAL | 0 refills | Status: AC | PRN

## 2023-09-06 MED ORDER — AZITHROMYCIN 250 MG PO TABS: ORAL_TABLET | ORAL | 0 refills | Status: AC

## 2023-09-06 MED ORDER — SODIUM CHLORIDE 0.9 % IV SOLN
1.0000 g | Freq: Once | INTRAVENOUS | Status: DC
  Filled 2023-09-06: qty 10

## 2023-09-06 MED ORDER — AMOXICILLIN-POT CLAVULANATE 875-125 MG PO TABS
1.0000 | ORAL_TABLET | Freq: Once | ORAL | Status: AC
  Administered 2023-09-06: 1 via ORAL
  Filled 2023-09-06: qty 1

## 2023-09-06 NOTE — ED Notes (Signed)
EKG seen by MD. Elesa Massed

## 2023-09-06 NOTE — Discharge Instructions (Signed)
Take antibiotics for the full course as prescribed.  Drink plenty of fluids to stay well-hydrated.  Your kidney numbers on your lab testing today show that you appears slightly dehydrated.  See your doctor this week for an appointment to recheck on your symptoms.  Thank you for choosing [place] for your health care today!  Please see your primary doctor this week for a follow up appointment.   If you have any new, worsening, or unexpected symptoms call your doctor right away or come back to the emergency department for reevaluation.  It was my pleasure to care for you today.   Daneil Dan [place] Charon, MD

## 2023-09-06 NOTE — ED Triage Notes (Signed)
Pt reports SOB starting yesterday gradually worsening through the night. Worse when lying down. Pt reports pain during inspiration. Admits to nausea and fatigue. Denies CP, fever, vomiting.

## 2023-09-06 NOTE — ED Provider Notes (Signed)
[facility] Provider Note    Event Date/Time   First MD Initiated Contact with Patient 09/06/23 [phone]     (approximate)   History   Shortness of Breath   HPI  Karen Faulkner is a 67 y.o. female   Past medical history of hypertension and smoker who presents to the emergency department with shortness of breath and pleuritic right-sided lower chest pain.  Over the last 2 days has experienced symptoms.  Not radiation of pain and only when she takes a deep breath.  No fever or chills and denies cough other than her baseline smoker's cough with no increase in sputum production.  Some associated nausea with the pain.  No vomiting no GI bleeding no dysuria or other urinary symptoms.  External Medical Documents Reviewed: [facility] system clinical summary for past medical history and medications      Physical Exam   Triage Vital Signs: ED Triage Vitals  Encounter Vitals Group     BP 09/06/23 0447 (!) 138/92     Systolic BP Percentile --      Diastolic BP Percentile --      Pulse Rate 09/06/23 0445 93     Resp 09/06/23 0443 18     Temp 09/06/23 0443 98.9 F (37.2 C)     Temp src --      SpO2 09/06/23 0443 95 %     Weight 09/06/23 0444 170 lb (77.1 kg)     Height 09/06/23 0444 5\' 3"  (1.6 m)     Head Circumference --      Peak Flow --      Pain Score 09/06/23 0443 10     Pain Loc --      Pain Education --      Exclude from Growth Chart --     Most recent vital signs: Vitals:   09/06/23 0447 09/06/23 0833  BP: (!) 138/92 (!) 156/92  Pulse:  86  Resp:  18  Temp:  98.9 F (37.2 C)  SpO2:  93%    General: Awake, no distress.  CV:  Good peripheral perfusion.  Resp:  Normal effort.  Abd:  No distention.  Other:  Awake alert comfortable appearing with normal vital signs, afebrile, some decreased breath sounds/coarseness on the right lower lung fields.  No wheezing.  She also has right upper quadrant tenderness to palpation without rigidity or  guarding.   ED Results / Procedures / Treatments   Labs (all labs ordered are listed, but only abnormal results are displayed) Labs Reviewed  BASIC METABOLIC PANEL - Abnormal; Notable for the following components:      Result Value   Potassium 3.4 (*)    Glucose, Bld 146 (*)    BUN 33 (*)    Creatinine, Ser 1.29 (*)    GFR, Estimated 46 (*)    All other components within normal limits  CBC - Abnormal; Notable for the following components:   WBC 16.8 (*)    HCT 35.6 (*)    All other components within normal limits  HEPATIC FUNCTION PANEL - Abnormal; Notable for the following components:   AST 53 (*)    ALT 70 (*)    All other components within normal limits  RESP PANEL BY RT-PCR (RSV, FLU A&B, COVID)  [id number]  LIPASE, BLOOD  TROPONIN I (HIGH SENSITIVITY)     I ordered and reviewed the above labs they are notable for blood cell count is elevated at 16.8  EKG  ED ECG REPORT I, Pilar Jarvis, the attending physician, personally viewed and interpreted this ECG.   Date: 09/06/2023  EKG Time: 0450  Rate: 89  Rhythm: nsr  Axis: nl  Intervals:none  ST&T Change: no stemi    RADIOLOGY I independently reviewed and interpreted chest x-ray and see right lower lung field opacity concerning for pneumonia I also reviewed radiologist's formal read.   PROCEDURES:  Critical Care performed: No  Procedures   MEDICATIONS ORDERED IN ED: Medications  amoxicillin-clavulanate (AUGMENTIN) 875-125 MG per tablet 1 tablet (1 tablet Oral Given 09/06/23 0852)  azithromycin (ZITHROMAX) tablet 500 mg (500 mg Oral Given 09/06/23 1610)   IMPRESSION / MDM / ASSESSMENT AND PLAN / ED COURSE  I reviewed the triage vital signs and the nursing notes.                                Patient's presentation is most consistent with acute presentation with potential threat to life or bodily function.  Differential diagnosis includes, but is not limited to, pneumonia, CHF exacerbation, COPD  exacerbation, ACS or PE, sepsis, cholecystitis, choledocholithiasis, biliary colic   The patient is on the cardiac monitor to evaluate for evidence of arrhythmia and/or significant heart rate changes.  MDM:    67 year old hypertension and smoker patient with pleuritic right lower chest pain with focal lung sounds, leukocytosis, and opacification on chest x-ray consistent with a pneumonia.  Will start on community-acquired pneumonia coverage.  She also has some right upper quadrant tenderness to palpation albeit mild the pleuritic pain in this area may be due to biliary pathologies, so I assessed with liver function test which are largely unremarkable and then followed up with a right upper quadrant ultrasound which was unremarkable so I do think ultimately this is due to a lung infection.  I considered PE or ACS but her symptomatology and workup were less consistent with these more emergent cardiopulmonary conditions.  She is very hesitant to stay in the hospital with much prefer outpatient treatment.  I offered admission at this time for IV antibiotics for bacterial infection of the lungs but she declined opting for outpatient treatment.  Will discharge her on oral antibiotics for coverage of pneumonia and have her follow-up with PMD, per her wishes.  She understands to return with any new or worsening symptoms.  --  I spent 5 minutes counseling this patient on smoking cessation.  We spoke about the patient's current tobacco use, impact of smoking, assessed willingness to quit, methods for cessation including medical management and nicotine replacement therapy (which I prescribed to the patient) and advised follow-up with primary doctor to continue to address smoking cessation.      FINAL CLINICAL IMPRESSION(S) / ED DIAGNOSES   Final diagnoses:  Community acquired pneumonia of right lower lobe of lung  Encounter for smoking cessation counseling     Rx / DC Orders   ED Discharge Orders           Ordered    azithromycin (ZITHROMAX Z-PAK) 250 MG tablet        09/06/23 0836    nicotine polacrilex (NICOTINE MINI) 4 MG lozenge  As needed        09/06/23 0828    amoxicillin-clavulanate (AUGMENTIN) 875-125 MG tablet  2 times daily        09/06/23 0836             Note:  This  document was prepared using [employer], historic buildings and may include unintentional dictation errors.    Pilar Jarvis, MD 09/06/23 [phone]

## 2023-09-06 NOTE — ED Notes (Signed)
MD Modesto Charon at bedside

## 2024-01-05 ENCOUNTER — Ambulatory Visit
Admission: RE | Admit: 2024-01-05 | Discharge: 2024-01-05 | Disposition: A | Discharge status: Home / Self Care | Payer: Medicare Other | Source: Ambulatory Visit | Attending: Family Medicine | Admitting: Family Medicine

## 2024-01-05 ENCOUNTER — Other Ambulatory Visit: Payer: Self-pay | Admitting: Family Medicine

## 2024-01-05 DIAGNOSIS — M25511 Pain in right shoulder: Secondary | ICD-10-CM | POA: Diagnosis present

## 2024-06-13 ENCOUNTER — Other Ambulatory Visit: Payer: Self-pay | Admitting: Family Medicine

## 2024-06-13 DIAGNOSIS — Z87891 Personal history of nicotine dependence: Secondary | ICD-10-CM

## 2024-06-13 DIAGNOSIS — Z1231 Encounter for screening mammogram for malignant neoplasm of breast: Secondary | ICD-10-CM

## 2024-06-16 ENCOUNTER — Ambulatory Visit: Admission: RE | Admit: 2024-06-16 | Source: Ambulatory Visit

## 2024-09-06 ENCOUNTER — Ambulatory Visit: Admission: RE | Admit: 2024-09-06 | Source: Ambulatory Visit
